# Patient Record
Sex: Male | Born: 1960 | Hispanic: No | Marital: Married
Health system: Southern US, Community
[De-identification: ages and names within clinical notes are randomized; demographics above are authoritative.]

---

## 2017-12-14 ENCOUNTER — Other Ambulatory Visit: Payer: Self-pay

## 2017-12-14 ENCOUNTER — Emergency Department (HOSPITAL_COMMUNITY): Payer: Self-pay

## 2017-12-14 ENCOUNTER — Encounter (HOSPITAL_COMMUNITY): Payer: Self-pay

## 2017-12-14 ENCOUNTER — Emergency Department (HOSPITAL_COMMUNITY)
Admission: EM | Admit: 2017-12-14 | Discharge: 2017-12-14 | Discharge status: Against Medical Advice | Payer: Self-pay | Attending: Emergency Medicine | Admitting: Emergency Medicine

## 2017-12-14 DIAGNOSIS — I10 Essential (primary) hypertension: Secondary | ICD-10-CM | POA: Insufficient documentation

## 2017-12-14 DIAGNOSIS — E119 Type 2 diabetes mellitus without complications: Secondary | ICD-10-CM | POA: Insufficient documentation

## 2017-12-14 DIAGNOSIS — K819 Cholecystitis, unspecified: Secondary | ICD-10-CM | POA: Insufficient documentation

## 2017-12-14 LAB — URINALYSIS, ROUTINE W REFLEX MICROSCOPIC
BILIRUBIN URINE: NEGATIVE
Bacteria, UA: NONE SEEN
Ketones, ur: 20 mg/dL — AB
Leukocytes, UA: NEGATIVE
Nitrite: NEGATIVE
PH: 7 (ref 5.0–8.0)
Protein, ur: NEGATIVE mg/dL
SPECIFIC GRAVITY, URINE: 1.018 (ref 1.005–1.030)

## 2017-12-14 LAB — COMPREHENSIVE METABOLIC PANEL
ALK PHOS: 41 U/L (ref 38–126)
ALT: 28 U/L (ref 0–44)
AST: 19 U/L (ref 15–41)
Albumin: 4.1 g/dL (ref 3.5–5.0)
Anion gap: 9 (ref 5–15)
BUN: 17 mg/dL (ref 6–20)
CO2: 27 mmol/L (ref 22–32)
CREATININE: 1.02 mg/dL (ref 0.61–1.24)
Calcium: 9.3 mg/dL (ref 8.9–10.3)
Chloride: 103 mmol/L (ref 98–111)
GFR calc Af Amer: >60 mL/min (ref >60)
GLUCOSE: 156 mg/dL — AB (ref 70–99)
Potassium: 3.8 mmol/L (ref 3.5–5.1)
Sodium: 139 mmol/L (ref 135–145)
TOTAL PROTEIN: 7.7 g/dL (ref 6.5–8.1)
Total Bilirubin: 1.8 mg/dL — ABNORMAL HIGH (ref 0.3–1.2)

## 2017-12-14 LAB — CBC
HCT: 51.8 % (ref 39.0–52.0)
Hemoglobin: 16.8 g/dL (ref 13.0–17.0)
MCH: 29.3 pg (ref 26.0–34.0)
MCHC: 32.4 g/dL (ref 30.0–36.0)
MCV: 90.2 fL (ref 80.0–100.0)
PLATELETS: 172 10*3/uL (ref 150–400)
RBC: 5.74 MIL/uL (ref 4.22–5.81)
RDW: 13.4 % (ref 11.5–15.5)
WBC: 13.4 10*3/uL — ABNORMAL HIGH (ref 4.0–10.5)
nRBC: 0 % (ref 0.0–0.2)

## 2017-12-14 LAB — TROPONIN I: Troponin I: <0.03 ng/mL (ref <0.03)

## 2017-12-14 LAB — LIPASE, BLOOD: LIPASE: 63 U/L — AB (ref 11–51)

## 2017-12-14 MED ORDER — METRONIDAZOLE 500 MG PO TABS
500.0000 mg | ORAL_TABLET | Freq: Once | ORAL | Status: AC
  Administered 2017-12-14: 500 mg via ORAL
  Filled 2017-12-14: qty 1

## 2017-12-14 MED ORDER — IOPAMIDOL (ISOVUE-300) INJECTION 61%
100.0000 mL | Freq: Once | INTRAVENOUS | Status: AC | PRN
  Administered 2017-12-14: 100 mL via INTRAVENOUS

## 2017-12-14 MED ORDER — CEPHALEXIN 500 MG PO CAPS: 500.0000 mg | ORAL_CAPSULE | Freq: Four times a day (QID) | ORAL | 0 refills | Status: AC

## 2017-12-14 MED ORDER — METRONIDAZOLE 500 MG PO TABS: 500.0000 mg | ORAL_TABLET | Freq: Two times a day (BID) | ORAL | 0 refills | Status: AC

## 2017-12-14 MED ORDER — ONDANSETRON HCL 4 MG/2ML IJ SOLN
4.0000 mg | Freq: Once | INTRAMUSCULAR | Status: AC
  Administered 2017-12-14: 4 mg via INTRAVENOUS
  Filled 2017-12-14: qty 2

## 2017-12-14 MED ORDER — SODIUM CHLORIDE 0.9 % IV SOLN: 2.0000 g | Freq: Once | INTRAVENOUS | Status: DC

## 2017-12-14 MED ORDER — MORPHINE SULFATE (PF) 4 MG/ML IV SOLN
4.0000 mg | Freq: Once | INTRAVENOUS | Status: AC
  Administered 2017-12-14: 4 mg via INTRAVENOUS
  Filled 2017-12-14: qty 1

## 2017-12-14 MED ORDER — SUCRALFATE 1 G PO TABS
1.0000 g | ORAL_TABLET | Freq: Once | ORAL | Status: AC
  Administered 2017-12-14: 1 g via ORAL
  Filled 2017-12-14: qty 1

## 2017-12-14 MED ORDER — SODIUM CHLORIDE 0.9 % IJ SOLN
INTRAMUSCULAR | Status: AC
  Filled 2017-12-14: qty 50

## 2017-12-14 MED ORDER — CEPHALEXIN 500 MG PO CAPS
500.0000 mg | ORAL_CAPSULE | Freq: Once | ORAL | Status: AC
  Administered 2017-12-14: 500 mg via ORAL
  Filled 2017-12-14: qty 1

## 2017-12-14 MED ORDER — GI COCKTAIL ~~LOC~~
30.0000 mL | Freq: Once | ORAL | Status: AC
  Administered 2017-12-14: 30 mL via ORAL
  Filled 2017-12-14: qty 30

## 2017-12-14 MED ORDER — IOPAMIDOL (ISOVUE-300) INJECTION 61%
INTRAVENOUS | Status: AC
  Filled 2017-12-14: qty 100

## 2017-12-14 NOTE — ED Notes (Signed)
Pt and family member state that they have a flight in their home country in the morning and need to get back to have the surgery done there. Pt and family state they understand the consequences but need to leave.

## 2017-12-14 NOTE — ED Notes (Signed)
Pts daughter approached this nurse at the desk asking about the prescriptions for antibiotics and how many days it will last. This nurse explained that as the doctor has stated, the prescriptions are until they get home, not to cure him and that they should go to the hospital immediately when they get home. Pts daughter continues to ask about the amount of days, but states they will be home by tomorrow afternoon. This nurse re-explained that they should get to the hospital upon arrival and the amount of antibiotics will last if they get in tomorrow, but they will not heal him he still needs surgery.

## 2017-12-14 NOTE — ED Provider Notes (Signed)
[facility]-EMERGENCY DEPT Provider Note   CSN: [id number] Arrival date & time: 12/14/17  0040     History   Chief Complaint Chief Complaint  Patient presents with  . Abdominal Pain    HPI Dylan Carr is a 57 y.o. male.  The history is provided by the patient and medical records.  Abdominal Pain   Associated symptoms include nausea.     57 year old male with history of high blood pressure, diabetes, acid reflux, presenting to the ED for abdominal/chest pain.  Patient with limited [place], daughter at the bedside assisting translation.  Patient has been having intermittent symptoms for the past 3 days, no alleviating or exacerbating factors noted.  States when pain occurs it is very sharp and stabbing and substernal chest region with some pains in the back.  Has reported some nausea but denies vomiting.  No diaphoresis, shortness of breath, palpitations, dizziness, weakness, numbness, left arm or neck pain.  He has no known cardiac history.  He is not a smoker.  Daughter reports he has been eating some spicy chili and has been drinking acidic juices like orange juice lately.  He has been taking Nexium and Tums without much relief.  History reviewed. No pertinent past medical history.  There are no active problems to display for this patient.      Home Medications    Prior to Admission medications   Not on File    Family History History reviewed. No pertinent family history.  Social History Social History   Tobacco Use  . Smoking status: Not on file  Substance Use Topics  . Alcohol use: Not on file  . Drug use: Not on file     Allergies   Patient has no known allergies.   Review of Systems Review of Systems  Gastrointestinal: Positive for abdominal pain and nausea.  All other systems reviewed and are negative.    Physical Exam Updated Vital Signs BP (!) 188/102   Pulse 90   Temp 99.1 F (37.3 C) (Oral)   Resp 16   SpO2  98%   Physical Exam  Constitutional: He is oriented to person, place, and time. He appears well-developed and well-nourished.  HENT:  Head: Normocephalic and atraumatic.  Mouth/Throat: Oropharynx is clear and moist.  Eyes: Pupils are equal, round, and reactive to light. Conjunctivae and EOM are normal.  Neck: Normal range of motion.  Cardiovascular: Normal rate, regular rhythm and normal heart sounds.  Pulmonary/Chest: Effort normal and breath sounds normal.  Abdominal: Soft. Bowel sounds are normal. There is no tenderness. There is no rigidity and no guarding.  No focal tenderness to palpation, normal bowel sounds  Musculoskeletal: Normal range of motion.  Neurological: He is alert and oriented to person, place, and time.  Skin: Skin is warm and dry.  Psychiatric: He has a normal mood and affect.  Nursing note and vitals reviewed.    ED Treatments / Results  Labs (all labs ordered are listed, but only abnormal results are displayed) Labs Reviewed  LIPASE, BLOOD - Abnormal; Notable for the following components:      Result Value   Lipase 63 (*)    All other components within normal limits  COMPREHENSIVE METABOLIC PANEL - Abnormal; Notable for the following components:   Glucose, Bld 156 (*)    Total Bilirubin 1.8 (*)    All other components within normal limits  CBC - Abnormal; Notable for the following components:   WBC 13.4 (*)  All other components within normal limits  URINALYSIS, ROUTINE W REFLEX MICROSCOPIC - Abnormal; Notable for the following components:   Color, Urine STRAW (*)    Glucose, UA >=500 (*)    Hgb urine dipstick MODERATE (*)    Ketones, ur 20 (*)    All other components within normal limits  TROPONIN I    EKG EKG Interpretation  Date/Time:  Monday December 14 2017 02:56:50 EDT Ventricular Rate:  91 PR Interval:    QRS Duration: 103 QT Interval:  365 QTC Calculation: 450 R Axis:   145 Text Interpretation:  Sinus rhythm Right axis deviation  [id number] No old tracing to compare Confirmed by Derwood Kaplan [phone]) on 12/14/2017 3:50:07 AM   Radiology Dg Chest 2 View  Result Date: 12/14/2017 CLINICAL DATA:  Epigastric and substernal chest pain. EXAM: CHEST - 2 VIEW COMPARISON:  None. FINDINGS: The cardiomediastinal contours are normal. The lungs are clear. Pulmonary vasculature is normal. No consolidation, pleural effusion, or pneumothorax. No acute osseous abnormalities are seen. IMPRESSION: No acute chest finding. Electronically Signed   By: Narda Rutherford M.D.   On: 12/14/2017 02:28    Procedures Procedures (including critical care time)  Medications Ordered in ED Medications  iopamidol (ISOVUE-300) 61 % injection (has no administration in time range)  sodium chloride 0.9 % injection (has no administration in time range)  cefTRIAXone (ROCEPHIN) 2 g in sodium chloride 0.9 % 100 mL IVPB (has no administration in time range)  gi cocktail (Maalox,Lidocaine,Donnatal) (30 mLs Oral Given 12/14/17 0244)  sucralfate (CARAFATE) tablet 1 g (1 g Oral Given 12/14/17 0243)  morphine 4 MG/ML injection 4 mg (4 mg Intravenous Given 12/14/17 0421)  ondansetron (ZOFRAN) injection 4 mg (4 mg Intravenous Given 12/14/17 0421)  iopamidol (ISOVUE-300) 61 % injection 100 mL (100 mLs Intravenous Contrast Given 12/14/17 0451)     Initial Impression / Assessment and Plan / ED Course  I have reviewed the triage vital signs and the nursing notes.  Pertinent labs & imaging results that were available during my care of the patient were reviewed by me and considered in my medical decision making (see chart for details).  57 y.o. M here with abdominal pain.  Epigastric/substernal in nature.  Intermittent x3 days, worse over the past few hours.  Reports eating spicy chili and drinking orange juice recently.  Has hx of GERD, unsure if this feels similar.  Denies cardiac history.  EKG non-ischemic.  Labs with leukocytosis, elevated bilirubin, mildly elevated  lipase at 63.  Patient given GI cocktail and dose of Carafate without much improvement.  On repeat assessment, states pain has not improved.  He has taken some of his own medication that he had in his pocket.  RN looked this up as it had [place] writing, seems to be an NSAID.  Apparently he has been taking this a lot lately.  Symptoms are concerning for GERD/gastritis, however given elevated bilirubin and leukocytosis, will obtain CT to rule out any other acute pathology.  CT with findings of acute cholecystitis.  Results discussed with patient as well as plan for admission.  Discussed with general surgery, Dr. Johna Sheriff-- temp admit orders, placed, morning team to see.   5:36 AM Daughter has now pulled me aside and states they do not want to stay for treatment here, they would prefer for him to have operation in their home country.  They will be going home in 5 days.  I tried to explain patient would likely have his surgery and  be discharged by that time, however states they do not want treatment here in the [place].  I have explained to daughter and the patient that he could get precipitously more symptomatic, become septic, have organ failure, or even death result from his condition being untreated, however there is still refusing any treatment here in the [place].  They are both of sound mind, capable of making medical decisions, and have acknowledged understanding of possible adverse outcome from refusing treatment here.  Patient will sign out AMA.  General surgery team was updated.  Final Clinical Impressions(s) / ED Diagnoses   Final diagnoses:  Cholecystitis    ED Discharge Orders         Ordered    cephALEXin (KEFLEX) 500 MG capsule  4 times daily     12/14/17 0608    metroNIDAZOLE (FLAGYL) 500 MG tablet  2 times daily     12/14/17 0608           Garlon Hatchet, PA-C 12/14/17 1610    Derwood Kaplan, MD 12/14/17 [phone]

## 2017-12-14 NOTE — ED Notes (Signed)
Pt took his own pain medication "ponstan" 500mg 

## 2017-12-14 NOTE — ED Notes (Signed)
Pt left room without signing AMA form

## 2017-12-14 NOTE — ED Triage Notes (Signed)
Pt reports epigastric abdominal pain today. Denies N/V. A&Ox4. Hx HTN and diabetes. Reports compliance with medications for both. Foreign language speaking, daughter with patient.

## 2017-12-14 NOTE — Discharge Instructions (Addendum)
We have recommended you be admitted for surgery to remove your gallbladder, however you are refusing. We have explain to you that this is not a good idea, that you could worsen, become very sick, have organ failure, or even die. If you change your mind, you can come back for admission and surgical intervention. Otherwise, you need to see a surgeon as soon as you get back home.

## 2017-12-14 NOTE — ED Notes (Signed)
Pt ambulated to the restroom with no difficulty.  Requesting pain medication.

## 2020-03-04 IMAGING — CT CT ABD-PELV W/ CM
2 of 5 series · 16 of 46 positions shown, 18 images · IV contrast (ISOVUE)
Comparison: None.

CLINICAL DATA: Epigastric pain for 3 days. Elevated bilirubin.
Pancreatitis suspected, atypical presentation/labs.

EXAM:
CT ABDOMEN AND PELVIS WITH CONTRAST
TECHNIQUE: Multidetector CT imaging of the abdomen and pelvis was performed
using the standard protocol following bolus administration of
intravenous contrast.
CONTRAST:  100mL VI44GJ-USS IOPAMIDOL (VI44GJ-USS) INJECTION 61%

[Series 2: axial st · axial · 0.98mm/px · z∈[+1060,+1470]mm · 13 of 96 slices shown, 15 images]
[im 7/96  soft-tissue]
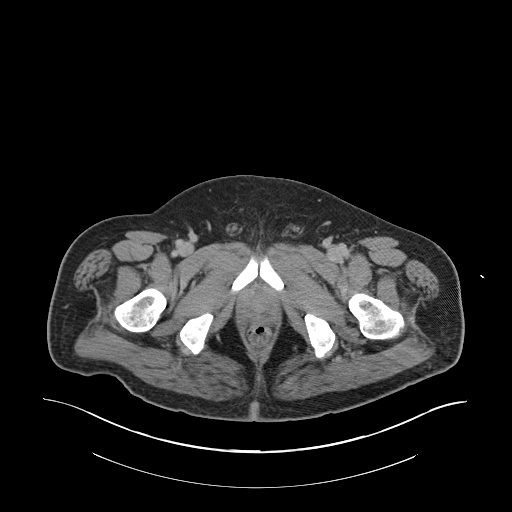
[im 7/96  bone]
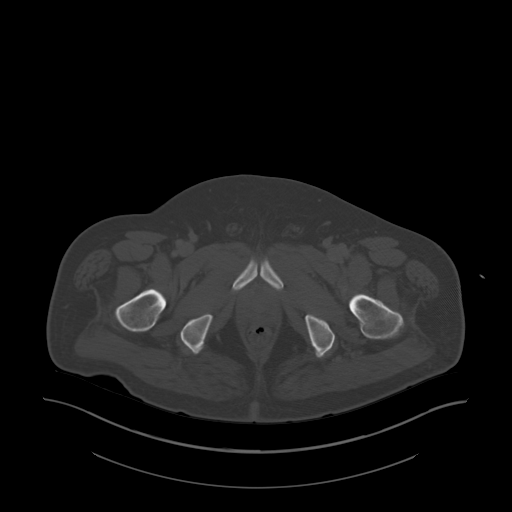
[im 13/96  soft-tissue]
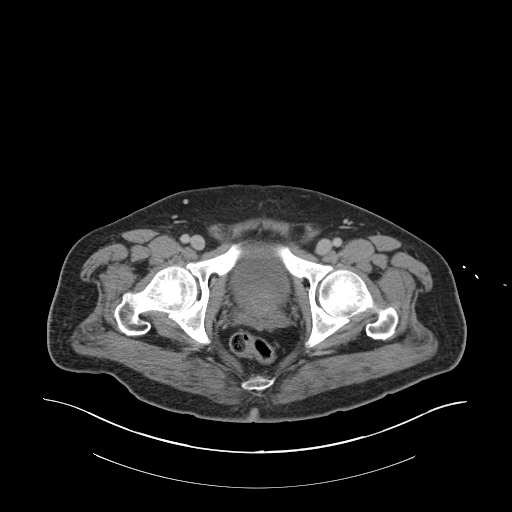
[im 20/96  soft-tissue]
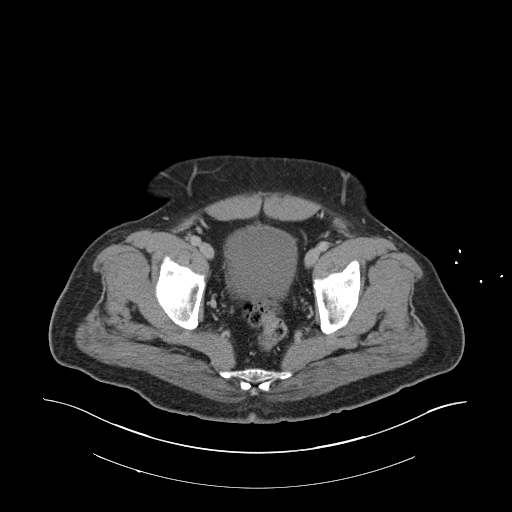
[im 26/96  soft-tissue]
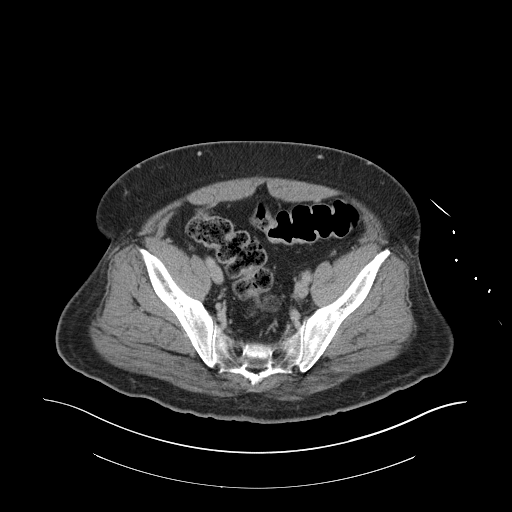
[im 32/96  soft-tissue]
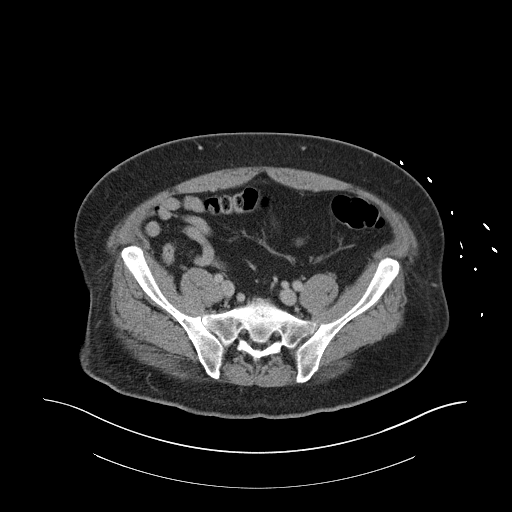
[im 39/96  soft-tissue]
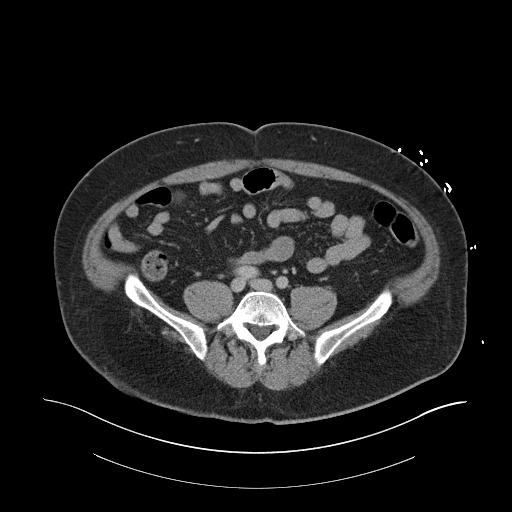
[im 51/96  soft-tissue]
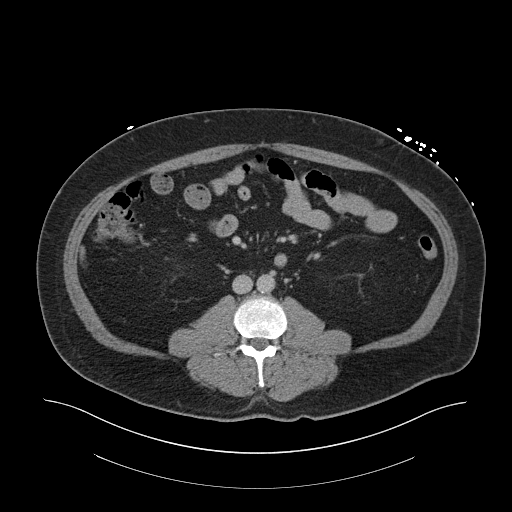
[im 58/96  soft-tissue]
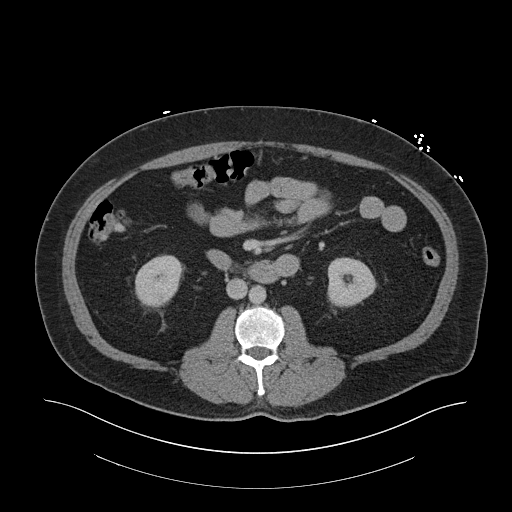
[im 64/96  soft-tissue]
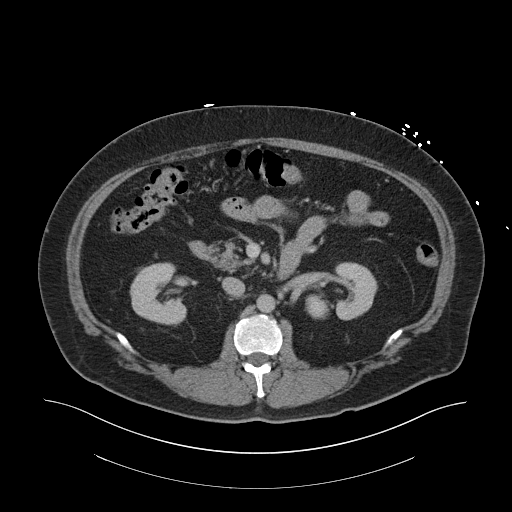
[im 64/96  bone]
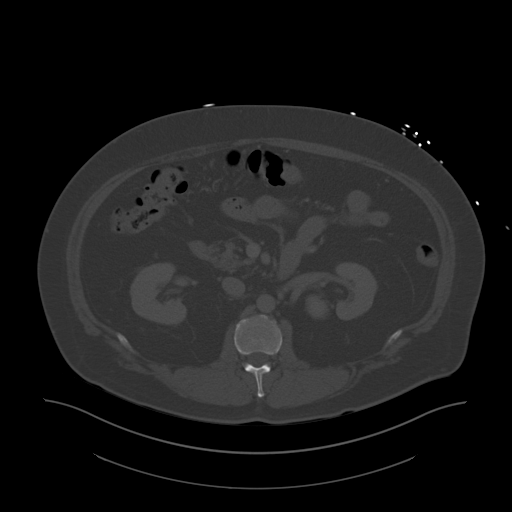
[im 70/96  soft-tissue]
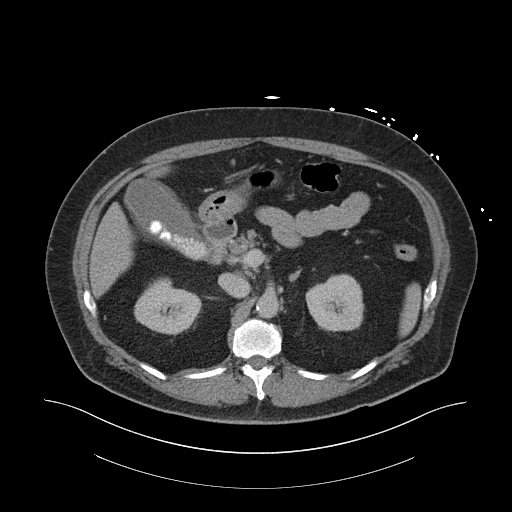
[im 77/96  soft-tissue]
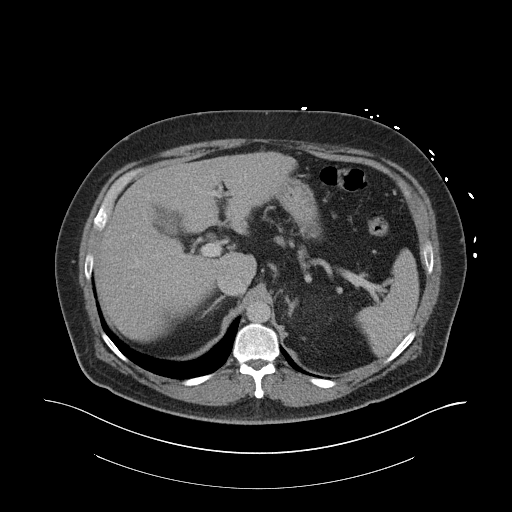
[im 83/96  soft-tissue]
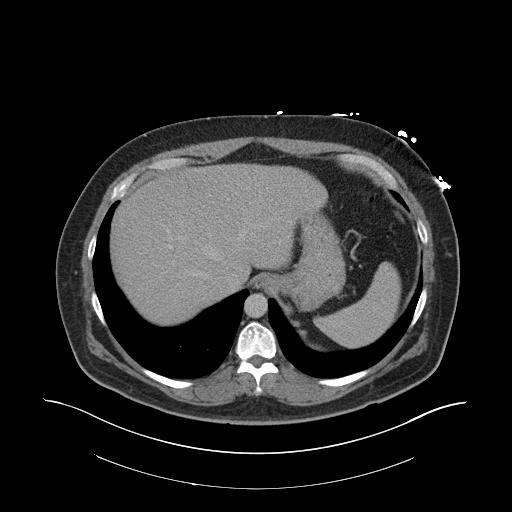
[im 89/96  soft-tissue]
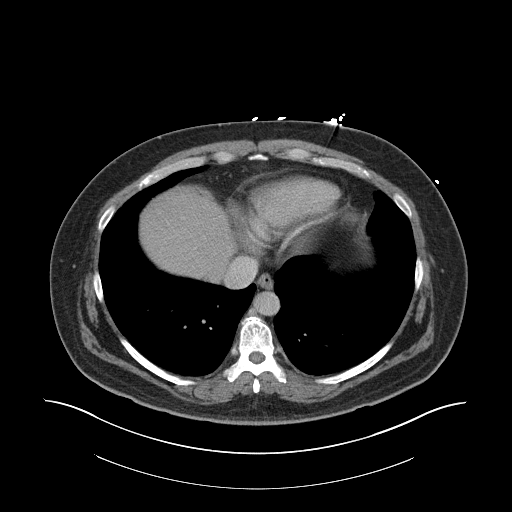

[Series 4: coronal st · coronal · 0.91mm/px · 3 of 111 slices shown]
[im 37/111  soft-tissue]
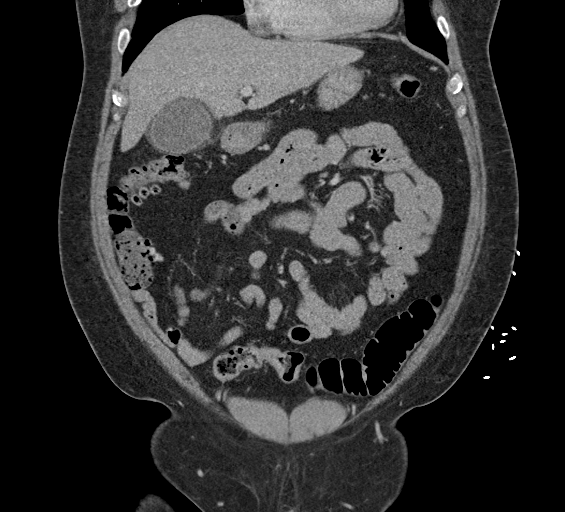
[im 49/111  soft-tissue]
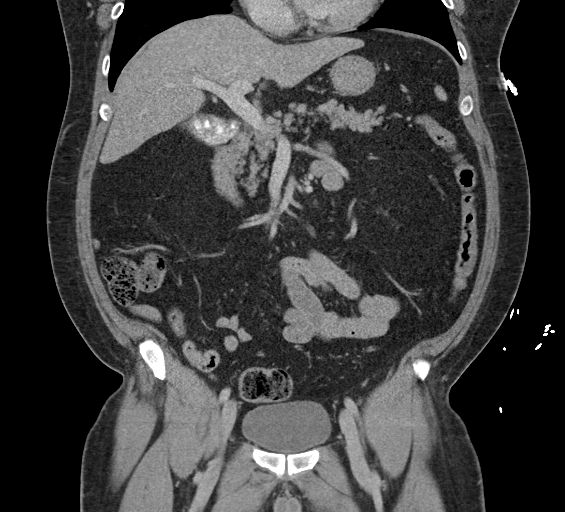
[im 62/111  soft-tissue]
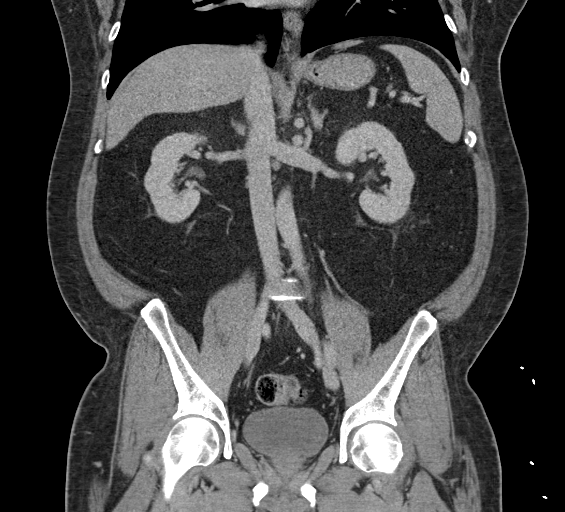

[16 of 46 positions shown; findings below may reference images not displayed]

FINDINGS: Lower chest: The lung bases are clear.

Hepatobiliary: Distended gallbladder containing innumerable
gallstones, including a stone in the gallbladder neck.. Mild
gallbladder wall thickening and pericholecystic stranding. No intra
or extrahepatic biliary ductal dilatation. No focal liver
abnormality is seen.

Pancreas: No ductal dilatation or inflammation.

Spleen: Normal in size without focal abnormality.

Adrenals/Urinary Tract: Normal adrenal glands. No hydronephrosis or
perinephric edema. Homogeneous renal enhancement with symmetric
excretion on delayed phase imaging. Urinary bladder is
physiologically distended without wall thickening.

Stomach/Bowel: Stomach is nondistended. Mild fecalization of distal
small bowel contents. No bowel wall thickening, inflammatory change
or obstruction. Normal appendix. Colonic diverticulosis, most
prominent in the ascending colon. No colonic wall thickening.

Vascular/Lymphatic: Minor aortic atherosclerosis. No aneurysm. No
enlarged lymph nodes in the abdomen or pelvis.

Reproductive: Enlarged prostate gland spanning 6.1 cm transverse.

Other: No free air, free fluid, or intra-abdominal fluid collection.

Musculoskeletal: Posterior disc osteophyte complex at T11-T12 causes
mass-effect on the spinal canal. There are no acute or suspicious
osseous abnormalities.
IMPRESSION: 1. CT findings consistent with acute cholecystitis with multiple
gallstones including a stone in the gallbladder neck, gallbladder
wall thickening and pericholecystic edema. No biliary dilatation.
2. Normal appearance of the pancreas without evidence pancreatitis.
3. Colonic diverticulosis most prominent in the ascending colon. No
diverticulitis.
4. Enlarged prostate gland.  Aortic Atherosclerosis ([id number]).
